# Patient Record
Sex: Female | Born: 1975 | Race: White | Hispanic: No | Marital: Single | Smoking: Former smoker
Health system: Southern US, Community
[De-identification: ages and names within clinical notes are randomized; demographics above are authoritative.]

---

## 2017-06-07 ENCOUNTER — Ambulatory Visit: Payer: Self-pay | Admitting: Physician Assistant

## 2017-06-07 ENCOUNTER — Encounter: Payer: Self-pay | Admitting: Physician Assistant

## 2017-06-07 ENCOUNTER — Other Ambulatory Visit: Payer: Self-pay

## 2017-06-07 ENCOUNTER — Ambulatory Visit (INDEPENDENT_AMBULATORY_CARE_PROVIDER_SITE_OTHER): Payer: Self-pay

## 2017-06-07 VITALS — BP 126/86 | HR 88 | Temp 98.4°F | Resp 16 | Ht 71.25 in | Wt 189.2 lb

## 2017-06-07 DIAGNOSIS — M25571 Pain in right ankle and joints of right foot: Secondary | ICD-10-CM

## 2017-06-07 MED ORDER — NAPROXEN 500 MG PO TABS: 500.0000 mg | ORAL_TABLET | Freq: Two times a day (BID) | ORAL | 1 refills | Status: AC

## 2017-06-07 NOTE — Progress Notes (Signed)
   Chelsey Taylor  MRN: [id number] DOB: April 01, 1975  PCP: Patient, No Pcp Per  Subjective:  Pt is a 42 year old female who presents to clinic for right foot pain x 1 month. Pain is described as dull but occasionally is sharp and shooting.  First noticed pain of the top of her foot. Pain is not present first thing in the morning. Worse with walking for extended periods of time. No known MOI.  She wears mostly flat shoes with no arch support. Recently bought [place] sandals with more arch support.  She has not taken any medications to help this.  Icing and elevating is not helping much.  No history of gout.   Review of Systems  Musculoskeletal: Positive for arthralgias (right foot) and gait problem.  Skin: Negative for wound.  Neurological: Negative for weakness and numbness.    There are no active problems to display for this patient.   No current outpatient medications on file prior to visit.   No current facility-administered medications on file prior to visit.     Allergies  Allergen Reactions  . Penicillins     Unknown, possibly a rash.  . Tetanus Toxoids     "severely allergic"     Objective:  BP 126/86 (BP Location: Left Arm, Patient Position: Sitting, Cuff Size: Normal)   Pulse 88   Temp 98.4 F (36.9 C) (Oral)   Resp 16   Ht 5' 11.25" (1.81 m)   Wt 189 lb 3.2 oz (85.8 kg)   LMP 05/25/2017   SpO2 98%   BMI 26.20 kg/m   Physical Exam  Constitutional: She is oriented to person, place, and time. No distress.  Musculoskeletal:       Right foot: There is tenderness and bony tenderness. There is normal range of motion, normal capillary refill, no crepitus and no deformity.       Feet:  Neurological: She is alert and oriented to person, place, and time.  Skin: Skin is warm and dry.  Psychiatric: Judgment normal.  Vitals reviewed.   Dg Foot Complete Right  Result Date: 06/07/2017 CLINICAL DATA:  Pain in joint involving right ankle and foot. Bruising and  tenderness to palpation at the second metatarsal. EXAM: RIGHT FOOT COMPLETE - 3+ VIEW COMPARISON:  None. FINDINGS: Focal medial cortical thickening at the second metatarsal distal shaft. No fracture line is seen. No malalignment, erosion, or joint narrowing. IMPRESSION: Cortical thickening of the symptomatic second metatarsal compatible with stress reaction. No fracture lucency. Electronically Signed   By: Marnee Spring M.D.   On: 06/07/2017 12:57    Assessment and Plan :  1. Pain in joint involving right ankle and foot - DG Foot Complete Right; Future - naproxen (NAPROSYN) 500 MG tablet; Take 1 tablet (500 mg total) by mouth 2 (two) times daily with a meal.  Dispense: 60 tablet; Refill: 1 - pt presents with worsening foot pain x 1 month. PE and x-ray is suspicious for possible metatarsal stress fracture. Plan to treat with rest, walking shoe, NSAID and ice. RTC in 4 weeks if no improvement. Consider sports medicine referral if needed. She understands and agrees with plan.    Marco Collie, PA-C  Primary Care at [facility] 06/07/2017 12:18 PM

## 2017-06-07 NOTE — Patient Instructions (Addendum)
I am treating you today for a stress fracture. The keys to treatment are rest, ice and NSAIDs.   1) Wear the orthotic shoe while you are up and moving on a daily basis for the next 2-3 weeks. Do not participate in activities that exacerbate your pain for the next 6-8 weeks.   2) Naproxen is an NSAID. Do not use with any other otc pain medication other than tylenol/acetaminophen - so no aleve, ibuprofen, motrin, advil, etc. Stop taking this 2-3 days after foot pain has completely resolved.   3) Put ice in a plastic bag. Place a towel between your skin and the bag or between your plaster splint and the bag. Leave the ice on for 20 minutes, 2-3 times a day. Ibuprofen and/or Tylenol for pain. Keep the area elevated above the level of your heart. Do this 2-3 times a day.   Come back and see me in 4 weeks.  If you are not improving in 6-8 weeks I can refer you to sports medicine for further evaluation and treatment.    In the future: Go to the [employer] store and get orthotic inserts. Wear supportive shoes with good arch support daily.    Stress Fracture Stress fracture is a small break or crack in a bone. A stress fracture can be fully broken (complete) or partially broken (incomplete). The most common sites for stress fractures are the bones in the front of your feet (metatarsals), your heels (calcaneus), and the long bone of your lower leg (tibia). What are the causes? A stress fracture is caused by overuse or repetitive exercise, such as running. It happens when a bone cannot absorb any more shock because the muscles around it are weak. Stress fractures happen most commonly when:  You rapidly increase or start a new physical activity.  You use shoes that are worn out or do not fit you properly.  You exercise on a new surface.  What increases the risk? You may be at higher risk for this type of fracture if:  You have a condition that causes weak bones (osteoporosis).  You are female.  Stress fractures are more likely to occur in women.  What are the signs or symptoms? The most common symptom of a stress fracture is feeling pain when you are using the affected part of your body. The pain usually goes away when you are resting. Other symptoms may include:  Swelling of the affected area.  Pain in the area when it is touched.  Decreased pain while resting.  Stress fracture pain usually develops over time. How is this diagnosed? Diagnosis may include:  Medical history and physical exam.  X-rays.  Bone scan.  MRI.  How is this treated? Treatment depends on the severity of your stress fracture. Treatment usually involves resting, icing, compression, and elevation (RICE) of the affected part of your body. Treatment may also include:  Medicines to reduce inflammation.  A cast or a walking shoe.  Crutches.  Surgery.  Follow these instructions at home: If you have a cast:  Do not stick anything inside the cast to scratch your skin. Doing that increases your risk of infection.  Check the skin around the cast every day. Report any concerns to your health care provider. You may put lotion on dry skin around the edges of the cast. Do not apply lotion to the skin underneath the cast.  Keep the cast clean and dry.  Cover the cast with a watertight plastic bag to  protect it from water while you take a bath or a shower. Do not let the cast get wet.  Do not put pressure on any part of the cast until it is fully hardened. This may take several hours. If You Have a Walking Shoe:   Wear it as directed by your health care provider. Managing pain, stiffness, and swelling  If directed, apply ice to the injured area: ? Put ice in a plastic bag. ? Place a towel between your skin and the bag. ? Leave the ice on for 20 minutes, 2-3 times per day.  Move your fingers or toes often to avoid stiffness and to lessen swelling.  Raise the injured area above the level of your  heart while you are sitting or lying down. Activity  Rest as directed by your health care provider. Ask your health care provider if you may do alternative exercises, such as swimming or biking, while you are healing.  Return to your normal activities as directed by your health care provider. Ask your health care provider what activities are safe for you.  Perform range-of-motion exercises only as directed by your health care provider. Safety  Do not use the injured limb to support yourbody weight until your health care provider says that you can. Use crutches if your health care provider tells you to do so. General instructions  Do not use any tobacco products, including cigarettes, chewing tobacco, or electronic cigarettes. Tobacco can delay bone healing. If you need help quitting, ask your health care provider.  Take medicines only as directed by your health care provider.  Keep all follow-up visits as directed by your health care provider. This is important. How is this prevented?  Only wear shoes that: ? Fit well. ? Are not worn out.  Eat a healthy diet that contains vitamin D and calcium. This helps keeps your bones strong.  Be careful when you start a new physical activity. Give your body time to adjust.  Avoid doing only one kind of activity. Do different exercises, such as swimming and running, so that no single part of your body gets overused.  Do strength-training exercises. Contact a health care provider if:  Your pain gets worse.  You have new symptoms.  You have increased swelling. Get help right away if:  You lose feeling in the affected area. This information is not intended to replace advice given to you by your health care provider. Make sure you discuss any questions you have with your health care provider. Document Released: 04/10/2002 Document Revised: 09/17/2015 Document Reviewed: 08/23/2013 Elsevier Interactive Patient Education  2018 [place].  Naproxen Sodium oral tablet, extended-release What is this medicine? NAPROXEN (na PROX en) is a non-steroidal anti-inflammatory drug (NSAID). It is used to reduce swelling and to treat pain. This medicine may be used for dental pain, headache, or painful monthly periods. It is also used for painful joint and muscular problems such as arthritis, tendinitis, bursitis, and gout. This medicine may be used for other purposes; ask your health care provider or pharmacist if you have questions. COMMON BRAND NAME(S): Midol Extended Relief, Naprelan Dose Card What should I tell my health care provider before I take this medicine? They need to know if you have any of these conditions: -asthma -cigarette smoker -drink more than 3 alcohol containing drinks a day -heart disease or circulation problems such as heart failure or leg edema (fluid retention) -high blood pressure -kidney disease -liver disease -stomach bleeding or ulcers -an  unusual or allergic reaction to naproxen, aspirin, other NSAIDs, other medicines, foods, dyes, or preservatives -pregnant or trying to get pregnant -breast-feeding How should I use this medicine? Take this medicine by mouth with a glass of water. Follow the directions on the prescription label. Take this medicine with food if it upsets your stomach. Try to not lie down for at least 10 minutes after you take it. Take your medicine at regular intervals. Do not take your medicine more often than directed. Long-term, continuous use may increase the risk of heart attack or stroke. A special MedGuide will be given to you by the pharmacist with each prescription and refill. Be sure to read this information carefully each time. Talk to your pediatrician regarding the use of this medicine in children. Special care may be needed. Overdosage: If you think you have taken too much of this medicine contact a poison control center or emergency room at once. NOTE: This medicine is  only for you. Do not share this medicine with others. What if I miss a dose? If you miss a dose, take it as soon as you can. If it is almost time for your next dose, take only that dose. Do not take double or extra doses. What may interact with this medicine? -alcohol -aspirin -cidofovir -diuretics -lithium -methotrexate -other drugs for inflammation like ketorolac or prednisone -pemetrexed -probenecid -warfarin This list may not describe all possible interactions. Give your health care provider a list of all the medicines, herbs, non-prescription drugs, or dietary supplements you use. Also tell them if you smoke, drink alcohol, or use illegal drugs. Some items may interact with your medicine. What should I watch for while using this medicine? Tell your doctor or health care professional if your pain does not get better. Talk to your doctor before taking another medicine for pain. Do not treat yourself. This medicine does not prevent heart attack or stroke. In fact, this medicine may increase the chance of a heart attack or stroke. The chance may increase with longer use of this medicine and in people who have heart disease. If you take aspirin to prevent heart attack or stroke, talk with your doctor or health care professional. Do not take other medicines that contain aspirin, ibuprofen, or naproxen with this medicine. Side effects such as stomach upset, nausea, or ulcers may be more likely to occur. Many medicines available without a prescription should not be taken with this medicine. This medicine can cause ulcers and bleeding in the stomach and intestines at any time during treatment. Do not smoke cigarettes or drink alcohol. These increase irritation to your stomach and can make it more susceptible to damage from this medicine. Ulcers and bleeding can happen without warning symptoms and can cause death. You may get drowsy or dizzy. Do not drive, use machinery, or do anything that needs  mental alertness until you know how this medicine affects you. Do not stand or sit up quickly, especially if you are an older patient. This reduces the risk of dizzy or fainting spells. This medicine can cause you to bleed more easily. Try to avoid damage to your teeth and gums when you brush or floss your teeth. What side effects may I notice from receiving this medicine? Side effects that you should report to your doctor or health care professional as soon as possible: -black or bloody stools, blood in the urine or vomit -blurred vision -chest pain -difficulty breathing or wheezing -nausea or vomiting -severe stomach pain -skin rash,  skin redness, blistering or peeling skin, hives, or itching -slurred speech or weakness on one side of the body -swelling of eyelids, throat, lips -unexplained weight gain or swelling -unusually weak or tired -yellowing of eyes or skin Side effects that usually do not require medical attention (report to your doctor or health care professional if they continue or are bothersome): -constipation -headache -heartburn This list may not describe all possible side effects. Call your doctor for medical advice about side effects. You may report side effects to FDA at 1-800-FDA-1088. Where should I keep my medicine? Keep out of the reach of children. Store at room temperature between 20 and 25 degrees C (68 and 77 degrees F). Keep container tightly closed. Throw away any unused medicine after the expiration date. NOTE: This sheet is a summary. It may not cover all possible information. If you have questions about this medicine, talk to your doctor, pharmacist, or health care provider.  2018 Elsevier/Gold Standard (2009-01-20 20:26:54)  Thank you for coming in today. I hope you feel we met your needs.  Feel free to call PCP if you have any questions or further requests.  Please consider signing up for MyChart if you do not already have it, as this is a great way to  communicate with me.  Best,  Whitney McVey, PA-C  IF you received an x-ray today, you will receive an invoice from [facility]. Please contact [facility] at [phone] with questions or concerns regarding your invoice.   IF you received labwork today, you will receive an invoice from [facility]. Please contact [facility] at [phone] with questions or concerns regarding your invoice.   Our billing staff will not be able to assist you with questions regarding bills from these companies.  You will be contacted with the lab results as soon as they are available. The fastest way to get your results is to activate your My Chart account. Instructions are located on the last page of this paperwork. If you have not heard from [place] regarding the results in 2 weeks, please contact this office.

## 2019-05-07 IMAGING — DX DG FOOT COMPLETE 3+V*R*
3 series · 3 of 3 positions shown · non-contrast
Comparison: None.

CLINICAL DATA: Pain in joint involving right ankle and foot.
Bruising and tenderness to palpation at the second metatarsal.

EXAM:
RIGHT FOOT COMPLETE - 3+ VIEW

[foot ap]
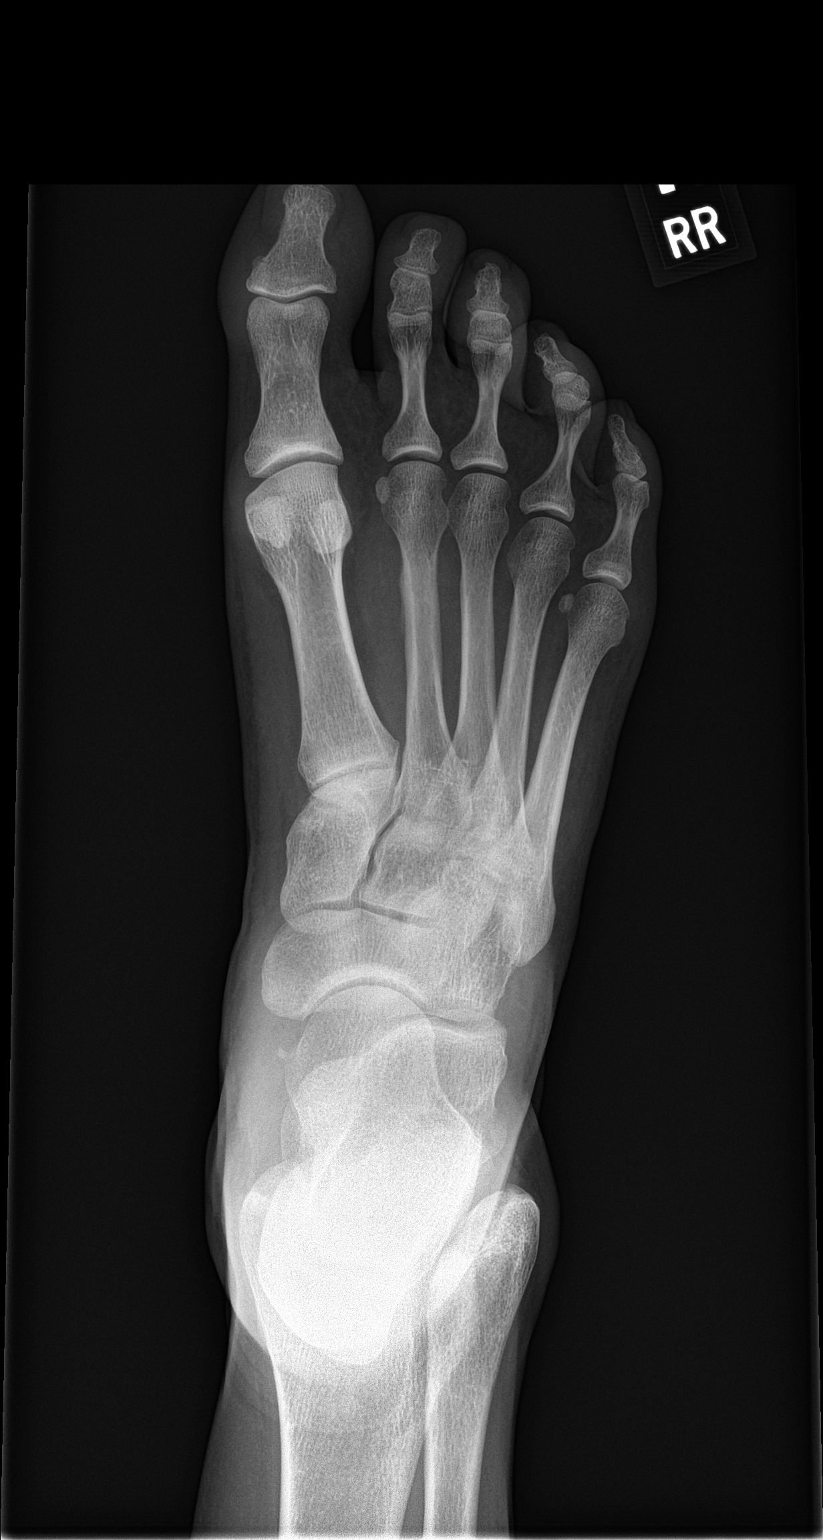

[foot obl]
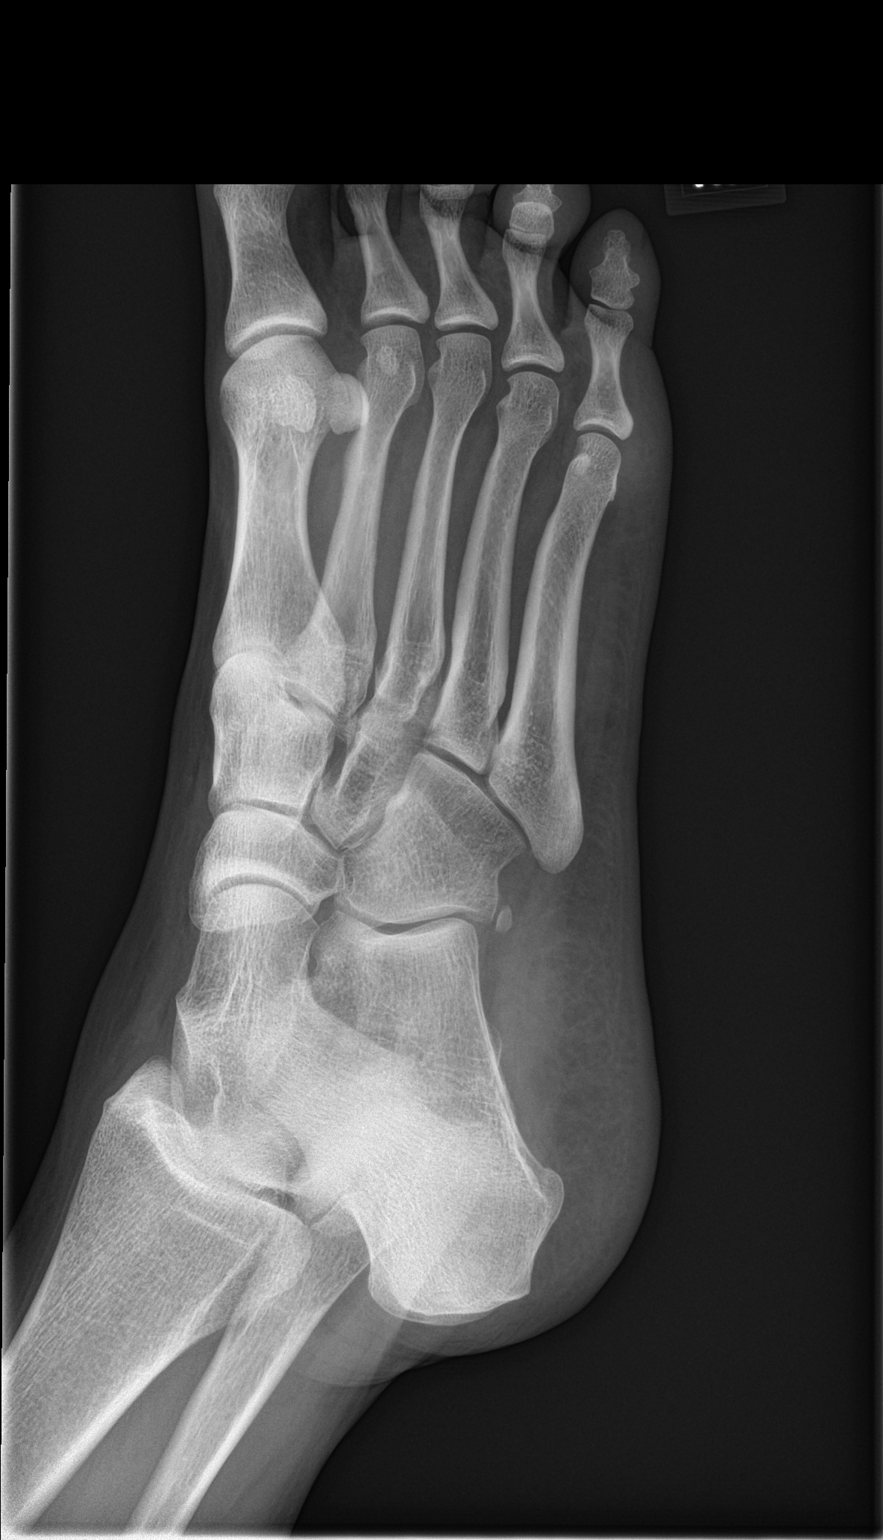

[foot lat]
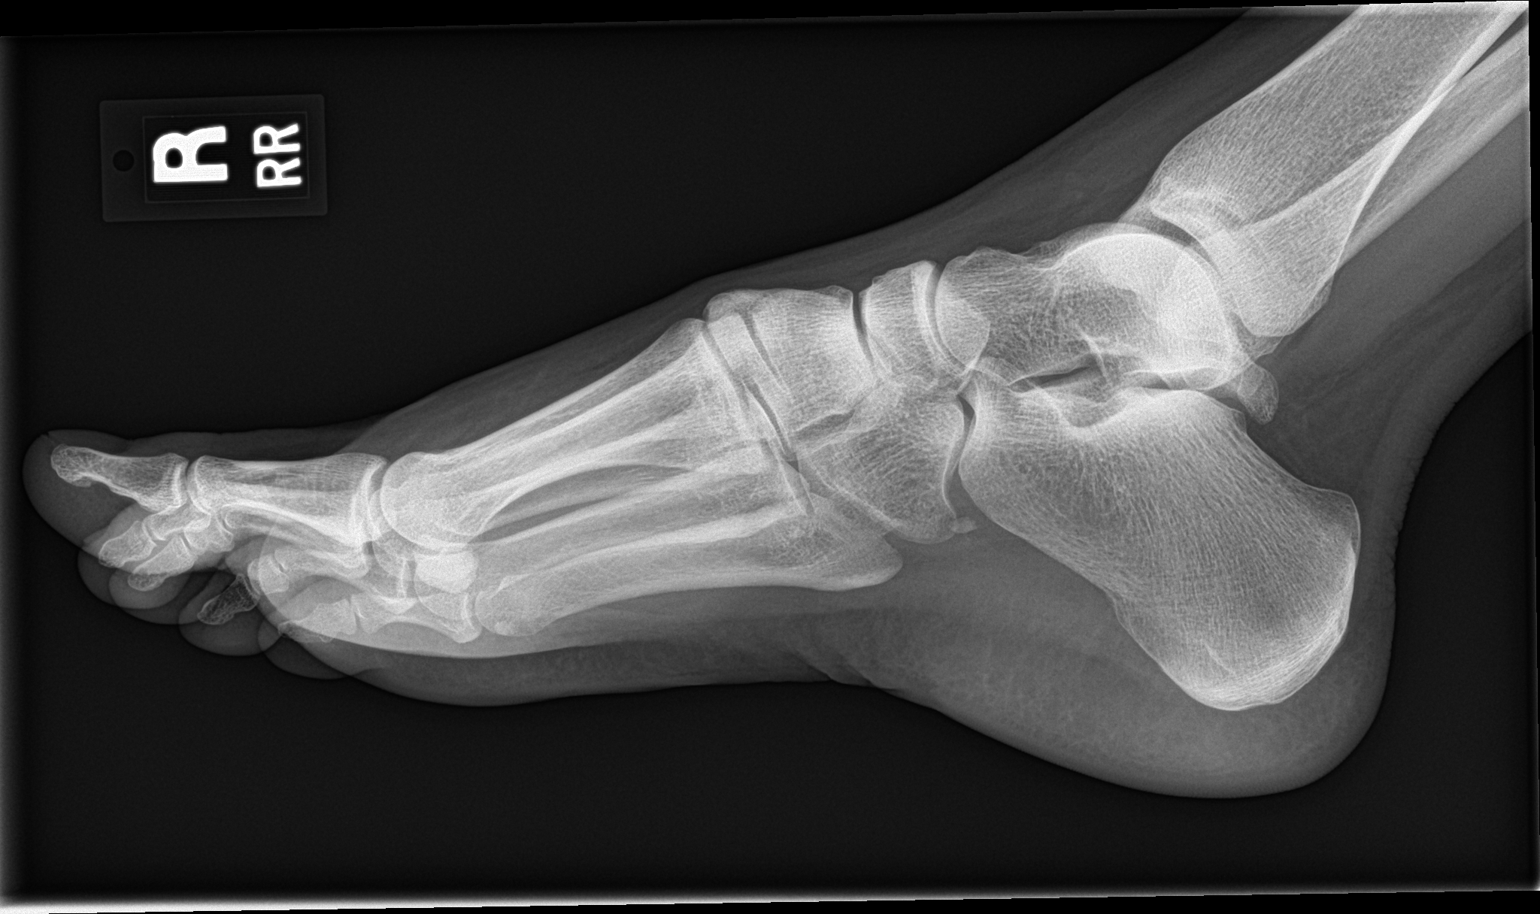

[3 of 3 positions shown; findings below may reference images not displayed]

FINDINGS: Focal medial cortical thickening at the second metatarsal distal
shaft. No fracture line is seen. No malalignment, erosion, or joint
narrowing.
IMPRESSION: Cortical thickening of the symptomatic second metatarsal compatible
with stress reaction. No fracture lucency.
